# Patient Record
Sex: Female | Born: 2000
Health system: Southern US, Community
[De-identification: ages and names within clinical notes are randomized; demographics above are authoritative.]

## PROBLEM LIST (undated history)

## (undated) DIAGNOSIS — Z87820 Personal history of traumatic brain injury: Secondary | ICD-10-CM

## (undated) DIAGNOSIS — F32A Depression, unspecified: Secondary | ICD-10-CM

## (undated) DIAGNOSIS — F509 Eating disorder, unspecified: Secondary | ICD-10-CM

## (undated) DIAGNOSIS — N390 Urinary tract infection, site not specified: Secondary | ICD-10-CM

## (undated) DIAGNOSIS — S36113A Laceration of liver, unspecified degree, initial encounter: Secondary | ICD-10-CM

## (undated) DIAGNOSIS — Z789 Other specified health status: Secondary | ICD-10-CM

## (undated) HISTORY — DX: Eating disorder, unspecified: F50.9

## (undated) HISTORY — DX: Laceration of liver, unspecified degree, initial encounter: S36.113A

## (undated) HISTORY — DX: Urinary tract infection, site not specified: N39.0

## (undated) HISTORY — DX: Personal history of traumatic brain injury: Z87.820

## (undated) HISTORY — DX: Depression, unspecified: F32.A

---
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
## 2018-01-16 ENCOUNTER — Other Ambulatory Visit: Payer: Self-pay

## 2018-01-16 ENCOUNTER — Ambulatory Visit: Payer: BC Managed Care – PPO | Admitting: Family Medicine

## 2018-01-16 ENCOUNTER — Encounter: Payer: Self-pay | Admitting: Family Medicine

## 2018-01-16 VITALS — BP 116/76 | HR 88 | Temp 98.3°F | Ht 71.0 in | Wt 212.4 lb

## 2018-01-16 DIAGNOSIS — J029 Acute pharyngitis, unspecified: Secondary | ICD-10-CM | POA: Diagnosis not present

## 2018-01-16 DIAGNOSIS — J020 Streptococcal pharyngitis: Secondary | ICD-10-CM

## 2018-01-16 LAB — POCT RAPID STREP A (OFFICE): Rapid Strep A Screen: NEGATIVE

## 2018-01-16 MED ORDER — AZITHROMYCIN 250 MG PO TABS: ORAL_TABLET | ORAL | 0 refills | Status: DC

## 2018-01-16 NOTE — Progress Notes (Signed)
   Subjective  CC:  Chief Complaint  Patient presents with  . Sore Throat    x 10 days, nasal congestion and cough    HPI: Ashley Hodges is a 17 y.o. female who presents to the office today to address the problems listed above in the chief complaint.  C/o sore throat, with URI sxs without fever. No SOB or GI sxs. Thick productive cough. Has had strep in past. No known exposure ot strep or mono. OTC analgesics have been used with minimal or mild relief. Eating and drinking OK.  Some sweats. No sinus pain. I reviewed the patients updated PMH, FH, and SocHx.    Patient Active Problem List   Diagnosis Date Noted  . Stress fracture of lumbar vertebra 03/09/2017  . Adrenal hemorrhage (HCC) 09/23/2016  . Acne vulgaris 08/19/2016  . Family history of pulmonary embolism 08/19/2016   Current Meds  Medication Sig  . LESSINA-28 0.1-20 MG-MCG tablet Take 1 tablet by mouth daily.    Allergies: Patient has No Known Allergies.  Review of Systems: Constitutional: Negative for fever malaise or anorexia Cardiovascular: negative for chest pain Respiratory: negative for SOB or persistent cough Gastrointestinal: negative for abdominal pain  Objective  Vitals: BP 116/76   Pulse 88   Temp 98.3 F (36.8 C)   Ht 5\' 11"  (1.803 m)   Wt 212 lb 6.4 oz (96.3 kg)   SpO2 98%   BMI 29.62 kg/m  General: no acute distress , A&Ox3 HEENT: PEERL, conjunctiva normal, bilateral EAC and TMs are normal. Nares normal. Oropharynx moist with erythematous posterior pharynx with exudate, + cervical LAD, 2+ tonsils, midline uvula, neck is supple Cardiovascular:  RRR without murmur or gallop.  Respiratory:  Good breath sounds bilaterally, CTAB with normal respiratory effort Skin:  Warm, no rashes  Office Visit on 01/16/2018  Component Date Value Ref Range Status  . Rapid Strep A Screen 01/16/2018 Negative  Negative Final    Assessment  1. Sore throat   2. Strep throat      Plan   Treat for strep +/-  bronchitis. Zpak, and Supportive care with advil, tylenol, gargles etc discussed. Out of work today. RTO if sxs persist or worsen.   Declines flu vaccination  Follow up: due for wellness visit.     Commons side effects, risks, benefits, and alternatives for medications and treatment plan prescribed today were discussed, and the patient expressed understanding of the given instructions. Patient is instructed to call or message via MyChart if he/she has any questions or concerns regarding our treatment plan. No barriers to understanding were identified. We discussed Red Flag symptoms and signs in detail. Patient expressed understanding regarding what to do in case of urgent or emergency type symptoms.   Medication list was reconciled, printed and provided to the patient in AVS. Patient instructions and summary information was reviewed with the patient as documented in the AVS. This note was prepared with assistance of Dragon voice recognition software. Occasional wrong-word or sound-a-like substitutions may have occurred due to the inherent limitations of voice recognition software  Orders Placed This Encounter  Procedures  . POCT rapid strep A   Meds ordered this encounter  Medications  . azithromycin (ZITHROMAX) 250 MG tablet    Sig: Take 2 tabs today, then 1 tab daily for 4 days    Dispense:  1 each    Refill:  0

## 2018-01-16 NOTE — Patient Instructions (Signed)
Please return for your annual check up when you can.   You may use Delsym cough syrup or Mucinex DM to help with congestion and coughing. Take Advil or alleve for your sore throat.  Take all of your antibiotics.   If you have any questions or concerns, please don't hesitate to send me a message via MyChart or call the office at [phone]. Thank you for visiting with us today! It's our pleasure caring for you.

## 2018-03-26 ENCOUNTER — Ambulatory Visit: Payer: BC Managed Care – PPO | Admitting: Family Medicine

## 2018-03-26 ENCOUNTER — Ambulatory Visit (INDEPENDENT_AMBULATORY_CARE_PROVIDER_SITE_OTHER): Payer: BC Managed Care – PPO

## 2018-03-26 ENCOUNTER — Other Ambulatory Visit: Payer: Self-pay

## 2018-03-26 ENCOUNTER — Encounter: Payer: Self-pay | Admitting: Family Medicine

## 2018-03-26 VITALS — BP 118/64 | HR 67 | Temp 98.6°F | Resp 16 | Ht 71.0 in | Wt 206.5 lb

## 2018-03-26 DIAGNOSIS — M940 Chondrocostal junction syndrome [Tietze]: Secondary | ICD-10-CM | POA: Diagnosis not present

## 2018-03-26 DIAGNOSIS — R0781 Pleurodynia: Secondary | ICD-10-CM

## 2018-03-26 MED ORDER — DICLOFENAC SODIUM 75 MG PO TBEC: 75.0000 mg | DELAYED_RELEASE_TABLET | Freq: Two times a day (BID) | ORAL | 0 refills | Status: DC

## 2018-03-26 NOTE — Progress Notes (Signed)
Subjective  CC:  Chief Complaint  Patient presents with  . Chest discomfort    Started 1 - 2 weeks ago, episodes are sporadic. Pain are sharp at times.. Denies SOB, HA, and dizziness    HPI: Ashley Hodges is a 18 y.o. female who presents to the office today to address the problems listed above in the chief complaint.  Healthy 18 yo with acute intermittent left sided chest pain; at times at rest, sharp and associated with deep breaths. No cough, wheeze, uri sxs,f/c; no recent injuries. Also reports 2-3 months of similar sxs on the left side, although that is more dull and has been more constant. No neck pain. She is worried about heart disease or lung problems.  Denies GERD sxs. Appetite is good. Denies stressors.    Assessment  1. Costochondritis   2. Pleuritic chest pain      Plan   Most c/w coshtochondritis:  Education given. Start nsaids. Check cxr to reassure. Nl cardiac exam today.   Follow up: prn , due for CPE.  Visit date not found  Orders Placed This Encounter  Procedures  . DG Chest 2 View   Meds ordered this encounter  Medications  . diclofenac (VOLTAREN) 75 MG EC tablet    Sig: Take 1 tablet (75 mg total) by mouth 2 (two) times daily.    Dispense:  30 tablet    Refill:  0      I reviewed the patients updated PMH, FH, and SocHx.    Patient Active Problem List   Diagnosis Date Noted  . Stress fracture of lumbar vertebra 03/09/2017  . Adrenal hemorrhage (HCC) 09/23/2016  . Acne vulgaris 08/19/2016  . Family history of pulmonary embolism 08/19/2016   No outpatient medications have been marked as taking for the 03/26/18 encounter (Office Visit) with Willow Ora, MD.    Allergies: Patient has No Known Allergies. Family History: Patient family history includes Cancer in her paternal grandmother. Social History:  Patient  reports that she has never smoked. She has never used smokeless tobacco. She reports that she does not drink alcohol or use  drugs.  Review of Systems: Constitutional: Negative for fever malaise or anorexia Cardiovascular: negative for chest pain Respiratory: negative for SOB or persistent cough Gastrointestinal: negative for abdominal pain  Objective  Vitals: BP 118/64   Pulse 67   Temp 98.6 F (37 C) (Oral)   Resp 16   Ht 5\' 11"  (1.803 m)   Wt 206 lb 8 oz (93.7 kg)   LMP 03/02/2018   SpO2 99%   BMI 28.80 kg/m  General: no acute distress , A&Ox3 HEENT: PEERL, conjunctiva normal, Oropharynx moist,neck is supple Cardiovascular:  RRR without murmur or gallop.  Respiratory:  Good breath sounds bilaterally, CTAB with normal respiratory effort, + bilateral costochondral ttp that reproduces pain Gastrointestinal: soft, flat abdomen, normal active bowel sounds, no palpable masses, no hepatosplenomegaly, no appreciated hernias Skin:  Warm, no rashes     Commons side effects, risks, benefits, and alternatives for medications and treatment plan prescribed today were discussed, and the patient expressed understanding of the given instructions. Patient is instructed to call or message via MyChart if he/she has any questions or concerns regarding our treatment plan. No barriers to understanding were identified. We discussed Red Flag symptoms and signs in detail. Patient expressed understanding regarding what to do in case of urgent or emergency type symptoms.   Medication list was reconciled, printed and provided to the patient  in AVS. Patient instructions and summary information was reviewed with the patient as documented in the AVS. This note was prepared with assistance of Dragon voice recognition software. Occasional wrong-word or sound-a-like substitutions may have occurred due to the inherent limitations of voice recognition software

## 2018-03-26 NOTE — Patient Instructions (Signed)
Please return for your annual complete physical in the next 1-6 months.   Take the antiinflammatory medication twice a day with food to help with your pain for the next 10-14 days.   Please go to our [facility] office to get your xrays done. You can walk in M-F between 8am and 5pm. Tell them you are there for xrays ordered by me. They will send me the results, then I will let you know the results with instructions.   Address: [address] [place], [place], [place] [phone]  (office sits at [address] at [address] intersection; from here, turn left onto [place] [address]), take to [address], turn right and go for a mile or so, office will be on left across form [address] )  If you have any questions or concerns, please don't hesitate to send me a message via MyChart or call the office at [phone]. Thank you for visiting with [place] today! It's our pleasure caring for you.   Costochondritis  Costochondritis is swelling and irritation (inflammation) of the tissue (cartilage) that connects your ribs to your breastbone (sternum). This causes pain in the front of your chest. The pain usually starts gradually and involves more than one rib. What are the causes? The exact cause of this condition is not always known. It results from stress on the cartilage where your ribs attach to your sternum. The cause of this stress could be:  Chest injury (trauma).  Exercise or activity, such as lifting.  Severe coughing. What increases the risk? You may be at higher risk for this condition if you:  Are female.  Are 20 years old.  Recently started a new exercise or work activity.  Have low levels of vitamin D.  Have a condition that makes you cough frequently. What are the signs or symptoms? The main symptom of this condition is chest pain. The pain:  Usually starts gradually and can be sharp or dull.  Gets worse with deep breathing,  coughing, or exercise.  Gets better with rest.  May be worse when you press on the sternum-rib connection (tenderness). How is this diagnosed? This condition is diagnosed based on your symptoms, medical history, and a physical exam. Your health care provider will check for tenderness when pressing on your sternum. This is the most important finding. You may also have tests to rule out other causes of chest pain. These may include:  A chest X-ray to check for lung problems.  An electrocardiogram (ECG) to see if you have a heart problem that could be causing the pain.  An imaging scan to rule out a chest or rib fracture. How is this treated? This condition usually goes away on its own over time. Your health care provider may prescribe an NSAID to reduce pain and inflammation. Your health care provider may also suggest that you:  Rest and avoid activities that make pain worse.  Apply heat or cold to the area to reduce pain and inflammation.  Do exercises to stretch your chest muscles. If these treatments do not help, your health care provider may inject a numbing medicine at the sternum-rib connection to help relieve the pain. Follow these instructions at home:  Avoid activities that make pain worse. This includes any activities that use chest, abdominal, and side muscles.  If directed, put ice on the painful area: ? Put ice in a plastic bag. ? Place a towel between your skin and the bag. ?  Leave the ice on for 20 minutes, 2-3 times a day.  If directed, apply heat to the affected area as often as told by your health care provider. Use the heat source that your health care provider recommends, such as a moist heat pack or a heating pad. ? Place a towel between your skin and the heat source. ? Leave the heat on for 20-30 minutes. ? Remove the heat if your skin turns bright red. This is especially important if you are unable to feel pain, heat, or cold. You may have a greater risk of  getting burned.  Take over-the-counter and prescription medicines only as told by your health care provider.  Return to your normal activities as told by your health care provider. Ask your health care provider what activities are safe for you.  Keep all follow-up visits as told by your health care provider. This is important. Contact a health care provider if:  You have chills or a fever.  Your pain does not go away or it gets worse.  You have a cough that does not go away (is persistent). Get help right away if:  You have shortness of breath. This information is not intended to replace advice given to you by your health care provider. Make sure you discuss any questions you have with your health care provider. Document Released: 11/23/2004 Document Revised: 11/15/2016 Document Reviewed: 06/09/2015 Elsevier Interactive Patient Education  [employer].

## 2018-03-27 MED ORDER — AZITHROMYCIN 250 MG PO TABS
ORAL_TABLET | ORAL | 0 refills | Status: DC
Start: 2018-03-27 — End: 2018-10-03

## 2018-03-27 NOTE — Addendum Note (Signed)
Addended by: Asencion PartridgeANDY, Yitzchok Carriger on: 03/27/2018 12:01 PM   Modules accepted: Orders

## 2018-04-26 ENCOUNTER — Other Ambulatory Visit: Payer: Self-pay | Admitting: Family Medicine

## 2018-05-30 ENCOUNTER — Other Ambulatory Visit: Payer: Self-pay | Admitting: Family Medicine

## 2018-07-01 ENCOUNTER — Other Ambulatory Visit: Payer: Self-pay | Admitting: Family Medicine

## 2018-07-01 NOTE — Telephone Encounter (Signed)
Patient is due a cpe. Please call to schedule in the next 2-6 months. Thanks. Refilled ocp x 6 months

## 2018-10-03 ENCOUNTER — Encounter: Payer: Self-pay | Admitting: Family Medicine

## 2018-10-03 ENCOUNTER — Other Ambulatory Visit: Payer: Self-pay

## 2018-10-03 ENCOUNTER — Ambulatory Visit (INDEPENDENT_AMBULATORY_CARE_PROVIDER_SITE_OTHER): Payer: BC Managed Care – PPO | Admitting: Family Medicine

## 2018-10-03 VITALS — BP 166/70 | HR 68 | Temp 98.8°F | Resp 14 | Ht 71.0 in | Wt 169.8 lb

## 2018-10-03 DIAGNOSIS — Z23 Encounter for immunization: Secondary | ICD-10-CM

## 2018-10-03 DIAGNOSIS — N946 Dysmenorrhea, unspecified: Secondary | ICD-10-CM | POA: Diagnosis not present

## 2018-10-03 DIAGNOSIS — Z0000 Encounter for general adult medical examination without abnormal findings: Secondary | ICD-10-CM | POA: Diagnosis not present
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
## 2018-10-03 NOTE — Patient Instructions (Signed)
Please return in 12 months for your annual complete physical; please come fasting. Also please schedule a nurse visit in 6 months to get your 2nd Bexsero vaccination.  Congratulations on [employer]; it will be fantastic.  Keep up the healthy lifestyle.   Today you were given your 1st of 2 Bexsero vaccination. This helps protect you from meningitis B. The second is due in 6 months.   If you have any questions or concerns, please don't hesitate to send me a message via MyChart or call the office at [phone]. Thank you for visiting with Ashley Hodges today! It's our pleasure caring for you.   Preventive Care 57-68 Years Old, Female Preventive care refers to visits with your health care provider and lifestyle choices that can promote health and wellness. This includes:  A yearly physical exam. This may also be called an annual well check.  Regular dental visits and eye exams.  Immunizations.  Screening for certain conditions.  Healthy lifestyle choices, such as eating a healthy diet, getting regular exercise, not using drugs or products that contain nicotine and tobacco, and limiting alcohol use. What can I expect for my preventive care visit? Physical exam Your health care provider will check your:  Height and weight. This may be used to calculate body mass index (BMI), which tells if you are at a healthy weight.  Heart rate and blood pressure.  Skin for abnormal spots. Counseling Your health care provider may ask you questions about your:  Alcohol, tobacco, and drug use.  Emotional well-being.  Home and relationship well-being.  Sexual activity.  Eating habits.  Work and work Statistician.  Method of birth control.  Menstrual cycle.  Pregnancy history. What immunizations do I need?  Influenza (flu) vaccine  This is recommended every year. Tetanus, diphtheria, and pertussis (Tdap) vaccine  You may need a Td booster every 10 years. Varicella (chickenpox) vaccine  You may  need this if you have not been vaccinated. Human papillomavirus (HPV) vaccine  If recommended by your health care provider, you may need three doses over 6 months. Measles, mumps, and rubella (MMR) vaccine  You may need at least one dose of MMR. You may also need a second dose. Meningococcal conjugate (MenACWY) vaccine  One dose is recommended if you are age 44-21 years and a first-year college student living in a residence hall, or if you have one of several medical conditions. You may also need additional booster doses. Pneumococcal conjugate (PCV13) vaccine  You may need this if you have certain conditions and were not previously vaccinated. Pneumococcal polysaccharide (PPSV23) vaccine  You may need one or two doses if you smoke cigarettes or if you have certain conditions. Hepatitis A vaccine  You may need this if you have certain conditions or if you travel or work in places where you may be exposed to hepatitis A. Hepatitis B vaccine  You may need this if you have certain conditions or if you travel or work in places where you may be exposed to hepatitis B. Haemophilus influenzae type b (Hib) vaccine  You may need this if you have certain conditions. You may receive vaccines as individual doses or as more than one vaccine together in one shot (combination vaccines). Talk with your health care provider about the risks and benefits of combination vaccines. What tests do I need?  Blood tests  Lipid and cholesterol levels. These may be checked every 5 years starting at age 4.  Hepatitis C test.  Hepatitis B test. Screening  Diabetes screening. This is done by checking your blood sugar (glucose) after you have not eaten for a while (fasting).  Sexually transmitted disease (STD) testing.  BRCA-related cancer screening. This may be done if you have a family history of breast, ovarian, tubal, or peritoneal cancers.  Pelvic exam and Pap test. This may be done every 3 years  starting at age 42. Starting at age 12, this may be done every 5 years if you have a Pap test in combination with an HPV test. Talk with your health care provider about your test results, treatment options, and if necessary, the need for more tests. Follow these instructions at home: Eating and drinking   Eat a diet that includes fresh fruits and vegetables, whole grains, lean protein, and low-fat dairy.  Take vitamin and mineral supplements as recommended by your health care provider.  Do not drink alcohol if: ? Your health care provider tells you not to drink. ? You are pregnant, may be pregnant, or are planning to become pregnant.  If you drink alcohol: ? Limit how much you have to 0-1 drink a day. ? Be aware of how much alcohol is in your drink. In the [place] one drink equals one 12 oz bottle of beer (355 mL), one 5 oz glass of wine (148 mL), or one 1 oz glass of hard liquor (44 mL). Lifestyle  Take daily care of your teeth and gums.  Stay active. Exercise for at least 30 minutes on 5 or more days each week.  Do not use any products that contain nicotine or tobacco, such as cigarettes, e-cigarettes, and chewing tobacco. If you need help quitting, ask your health care provider.  If you are sexually active, practice safe sex. Use a condom or other form of birth control (contraception) in order to prevent pregnancy and STIs (sexually transmitted infections). If you plan to become pregnant, see your health care provider for a preconception visit. What's next?  Visit your health care provider once a year for a well check visit.  Ask your health care provider how often you should have your eyes and teeth checked.  Stay up to date on all vaccines. This information is not intended to replace advice given to you by your health care provider. Make sure you discuss any questions you have with your health care provider. Document Released: 04/11/2001 Document Revised: 10/25/2017 Document  Reviewed: 10/25/2017 Elsevier Patient Education  2020 [employer].

## 2018-12-15 ENCOUNTER — Other Ambulatory Visit: Payer: Self-pay | Admitting: Family Medicine

## 2018-12-16 ENCOUNTER — Other Ambulatory Visit: Payer: Self-pay | Admitting: *Deleted

## 2019-02-10 ENCOUNTER — Other Ambulatory Visit: Payer: Self-pay

## 2019-02-10 ENCOUNTER — Ambulatory Visit (INDEPENDENT_AMBULATORY_CARE_PROVIDER_SITE_OTHER): Payer: BC Managed Care – PPO | Admitting: Family Medicine

## 2019-02-10 DIAGNOSIS — J029 Acute pharyngitis, unspecified: Secondary | ICD-10-CM | POA: Diagnosis not present

## 2019-02-10 DIAGNOSIS — Z20822 Contact with and (suspected) exposure to covid-19: Secondary | ICD-10-CM

## 2019-02-10 MED ORDER — AZELASTINE HCL 0.1 % NA SOLN: 2.0000 | Freq: Two times a day (BID) | NASAL | 12 refills | Status: DC

## 2019-02-10 NOTE — Progress Notes (Signed)
    Chief Complaint:  Ashley Hodges is a 18 y.o. female who presents today for a virtual office visit with a chief complaint of sore throat.   Assessment/Plan:  Sore Throat Likely viral URI.  Will check for Covid.  No red flags.  Will start Astelin nasal spray.  Recommended good oral hydration.  Discussed reasons to return to care.  Can use over-the-counter medications as needed.  Follow-up as needed.  Discussed CDC recommendations for self-isolation.    Subjective:  HPI:  Sore Throat Started yesterday. Had a hard time sleeping last night. Some fatigue as well. Some headache this morning. Some subjective fevers and chills as well. Tried taking aleve which did not help. No sick contacts. No cough. Some nasal drainage and congestion. No chest pain or shortness of breath.  No other obvious aggravating or alleviating factors.  ROS: Per HPI  PMH: She reports that she has never smoked. She has never used smokeless tobacco. She reports that she does not drink alcohol or use drugs.      Objective/Observations  Physical Exam: Gen: NAD, resting comfortably Pulm: Normal work of breathing Neuro: Grossly normal, moves all extremities Psych: Normal affect and thought content  No results found for this or any previous visit (from the past 24 hour(s)).   Virtual Visit via Video   I connected with Ashley Hodges on 02/10/19 at 11:20 AM EST by a video enabled telemedicine application and verified that I am speaking with the correct person using two identifiers. The limitations of evaluation and management by telemedicine and the availability of in person appointments were discussed. The patient expressed understanding and agreed to proceed.   Patient location: Home Provider location: [place] participating in the virtual visit: Myself and Patient     Algis Greenhouse. Jerline Pain, MD 02/10/2019 10:11 AM

## 2019-02-11 LAB — NOVEL CORONAVIRUS, NAA: SARS-CoV-2, NAA: NOT DETECTED

## 2019-02-12 NOTE — Progress Notes (Signed)
Dr Marigene Ehlers interpretation of your lab work:  Good news! Your covid test is negative. Please let us know if your symptoms are not improving.    If you have any additional questions, please give [place] a call or send [place] a message through [place].  Take care, Dr Jerline Pain

## 2019-10-24 LAB — HM HEPATITIS C SCREENING LAB: HM Hepatitis Screen: NEGATIVE

## 2020-08-17 ENCOUNTER — Other Ambulatory Visit (INDEPENDENT_AMBULATORY_CARE_PROVIDER_SITE_OTHER): Payer: Self-pay | Admitting: Family Medicine

## 2020-08-17 ENCOUNTER — Other Ambulatory Visit (HOSPITAL_COMMUNITY): Payer: Self-pay

## 2020-08-17 DIAGNOSIS — U071 COVID-19: Secondary | ICD-10-CM

## 2020-08-17 MED ORDER — NIRMATRELVIR/RITONAVIR (PAXLOVID)TABLET
3.0000 | ORAL_TABLET | Freq: Two times a day (BID) | ORAL | 0 refills | Status: AC
  Filled 2020-08-17: qty 30, 5d supply, fill #0

## 2020-08-17 NOTE — Progress Notes (Signed)
+   COVID 19 Virus Infection. Normal GFR. Paxlovid sent to [facility]. Risk versus benefits of medication reviewed. The patient understands monitoring parameters and red flags. Helane Rima, DO

## 2021-01-12 ENCOUNTER — Telehealth: Payer: Self-pay

## 2021-01-12 NOTE — Telephone Encounter (Signed)
Pt is requesting TOC to Dr. Shanda Bumps Copland at [facility].  Please advise if this is okay with you.

## 2021-01-12 NOTE — Telephone Encounter (Signed)
Pt is a current pt of Dr. Asencion Partridge at [facility].  I will send a TOC request to her and then we can set pt up for an appt with Dr. Patsy Lager.

## 2021-01-12 NOTE — Telephone Encounter (Signed)
Dr Copland has agreed to see this pt. They are okay with whatever NP appointments are available. 

## 2021-02-04 NOTE — Patient Instructions (Addendum)
It was very nice to meet you today!  I will be in touch with your labs and x-rays asap If you feel like you have a UTI coming up please let me know and we can get a urine culture Test for covid at home to make sure you are negative- if positive let me know   Tetanus booster today
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
## 2021-02-09 ENCOUNTER — Encounter: Payer: Self-pay | Admitting: Family Medicine

## 2021-02-09 ENCOUNTER — Ambulatory Visit: Payer: Managed Care, Other (non HMO) | Admitting: Family Medicine

## 2021-02-09 ENCOUNTER — Other Ambulatory Visit: Payer: Self-pay

## 2021-02-09 ENCOUNTER — Ambulatory Visit (HOSPITAL_BASED_OUTPATIENT_CLINIC_OR_DEPARTMENT_OTHER)
Admission: RE | Admit: 2021-02-09 | Discharge: 2021-02-09 | Disposition: A | Discharge status: Home / Self Care | Payer: Managed Care, Other (non HMO) | Source: Ambulatory Visit | Attending: Family Medicine | Admitting: Family Medicine

## 2021-02-09 VITALS — BP 122/80 | HR 72 | Temp 98.1°F | Resp 18 | Ht 71.0 in | Wt 197.6 lb

## 2021-02-09 DIAGNOSIS — Z0000 Encounter for general adult medical examination without abnormal findings: Secondary | ICD-10-CM

## 2021-02-09 DIAGNOSIS — N390 Urinary tract infection, site not specified: Secondary | ICD-10-CM

## 2021-02-09 DIAGNOSIS — M4300 Spondylolysis, site unspecified: Secondary | ICD-10-CM

## 2021-02-09 DIAGNOSIS — R5383 Other fatigue: Secondary | ICD-10-CM

## 2021-02-09 DIAGNOSIS — G8929 Other chronic pain: Secondary | ICD-10-CM

## 2021-02-09 DIAGNOSIS — Z131 Encounter for screening for diabetes mellitus: Secondary | ICD-10-CM

## 2021-02-09 DIAGNOSIS — M545 Low back pain, unspecified: Secondary | ICD-10-CM

## 2021-02-09 DIAGNOSIS — Z13220 Encounter for screening for lipoid disorders: Secondary | ICD-10-CM | POA: Diagnosis not present

## 2021-02-09 DIAGNOSIS — Z1329 Encounter for screening for other suspected endocrine disorder: Secondary | ICD-10-CM

## 2021-02-09 DIAGNOSIS — Z130 Encounter for screening for diseases of the blood and blood-forming organs and certain disorders involving the immune mechanism: Secondary | ICD-10-CM | POA: Diagnosis not present

## 2021-02-09 DIAGNOSIS — Z23 Encounter for immunization: Secondary | ICD-10-CM | POA: Diagnosis not present

## 2021-02-09 LAB — COMPREHENSIVE METABOLIC PANEL
ALT: 9 U/L (ref 0–35)
AST: 16 U/L (ref 0–37)
Albumin: 4.1 g/dL (ref 3.5–5.2)
Alkaline Phosphatase: 57 U/L (ref 39–117)
BUN: 12 mg/dL (ref 6–23)
CO2: 28 mEq/L (ref 19–32)
Calcium: 9.3 mg/dL (ref 8.4–10.5)
Chloride: 103 mEq/L (ref 96–112)
Creatinine, Ser: 0.74 mg/dL (ref 0.40–1.20)
GFR: 114.92 mL/min (ref >60.00)
Glucose, Bld: 87 mg/dL (ref 70–99)
Potassium: 4.1 mEq/L (ref 3.5–5.1)
Sodium: 136 mEq/L (ref 135–145)
Total Bilirubin: 0.6 mg/dL (ref 0.2–1.2)
Total Protein: 7.2 g/dL (ref 6.0–8.3)

## 2021-02-09 LAB — CBC
HCT: 38.3 % (ref 36.0–46.0)
Hemoglobin: 12.7 g/dL (ref 12.0–15.0)
MCHC: 33.1 g/dL (ref 30.0–36.0)
MCV: 90 fl (ref 78.0–100.0)
Platelets: 271 10*3/uL (ref 150.0–400.0)
RBC: 4.25 Mil/uL (ref 3.87–5.11)
RDW: 13.1 % (ref 11.5–15.5)
WBC: 5.3 10*3/uL (ref 4.0–10.5)

## 2021-02-09 LAB — LIPID PANEL
Cholesterol: 179 mg/dL (ref 0–200)
HDL: 62 mg/dL (ref >39.00)
LDL Cholesterol: 95 mg/dL (ref 0–99)
NonHDL: 116.69
Total CHOL/HDL Ratio: 3
Triglycerides: 107 mg/dL (ref 0.0–149.0)
VLDL: 21.4 mg/dL (ref 0.0–40.0)

## 2021-02-09 LAB — VITAMIN D 25 HYDROXY (VIT D DEFICIENCY, FRACTURES): VITD: 24.07 ng/mL — ABNORMAL LOW (ref 30.00–100.00)

## 2021-02-09 LAB — TSH: TSH: 2.35 u[IU]/mL (ref 0.35–5.50)

## 2021-02-09 LAB — HEMOGLOBIN A1C: Hgb A1c MFr Bld: 5.4 % (ref 4.6–6.5)

## 2021-02-11 ENCOUNTER — Other Ambulatory Visit: Payer: Self-pay | Admitting: Family Medicine

## 2021-02-11 DIAGNOSIS — M4300 Spondylolysis, site unspecified: Secondary | ICD-10-CM

## 2021-02-11 NOTE — Addendum Note (Signed)
Addended by: Abbe Amsterdam C on: 02/11/2021 08:34 AM   Modules accepted: Orders

## 2021-10-05 ENCOUNTER — Encounter (INDEPENDENT_AMBULATORY_CARE_PROVIDER_SITE_OTHER): Payer: Self-pay

## 2022-04-26 IMAGING — DX DG LUMBAR SPINE COMPLETE 4+V
5 series · 5 of 5 positions shown · non-contrast
Comparison: None.

CLINICAL DATA: Pain lower back

EXAM:
LUMBAR SPINE - COMPLETE 4+ VIEW

[l-spine ap]
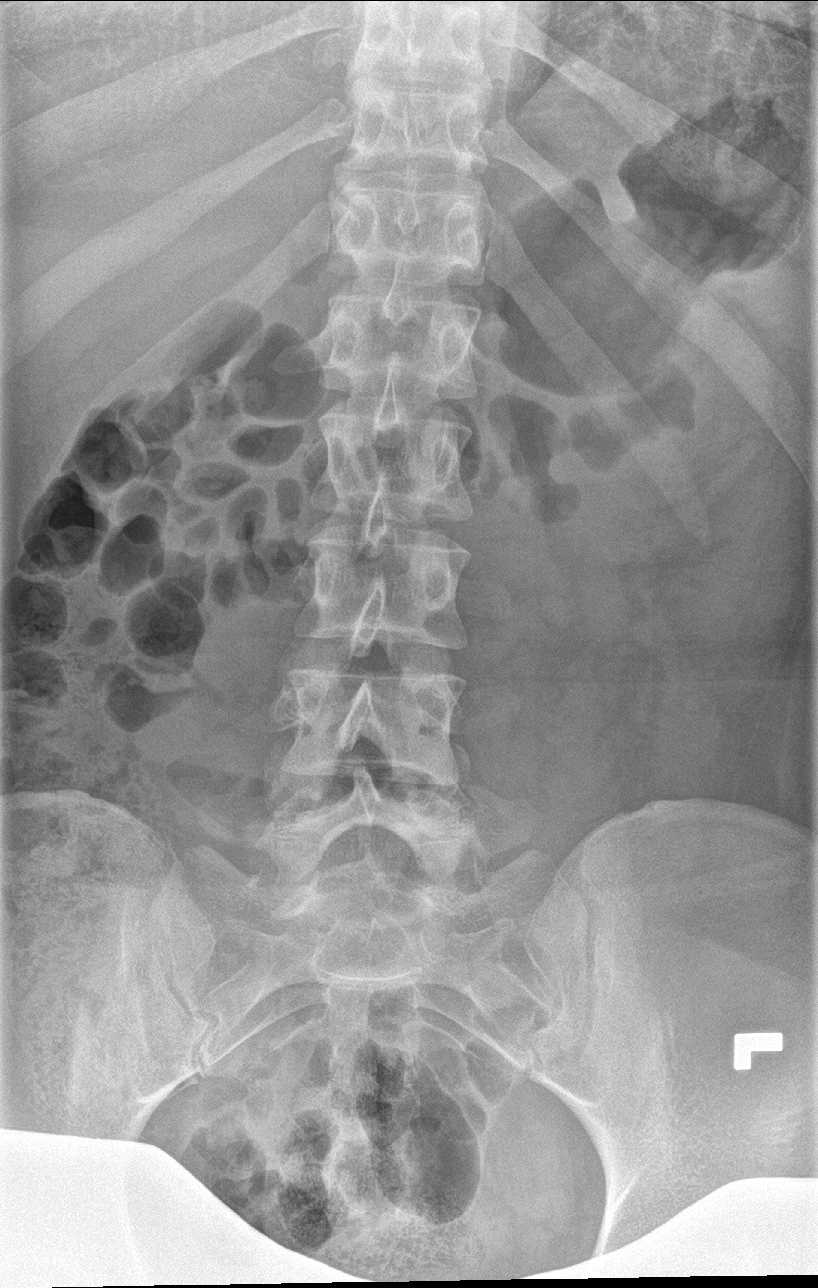

[l-spine obl (1 of 2)]
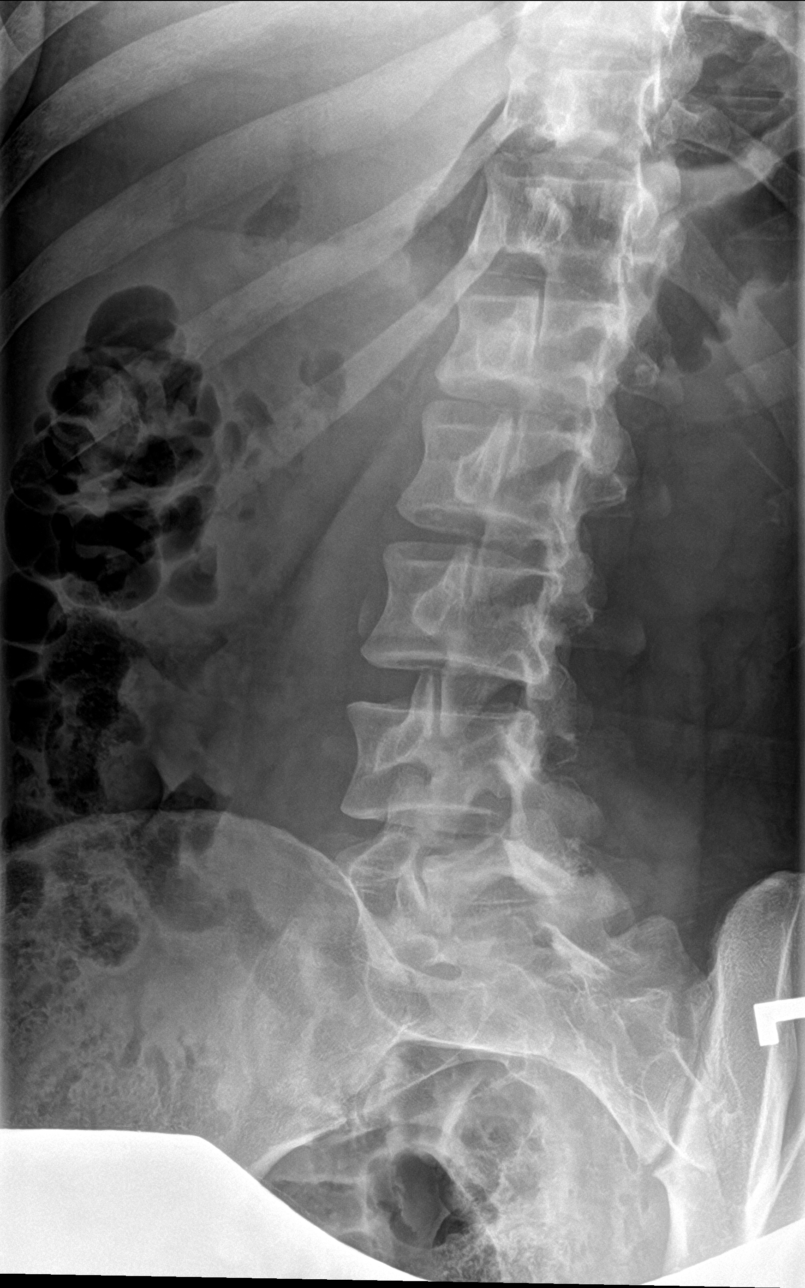

[l-spine obl (2 of 2)]
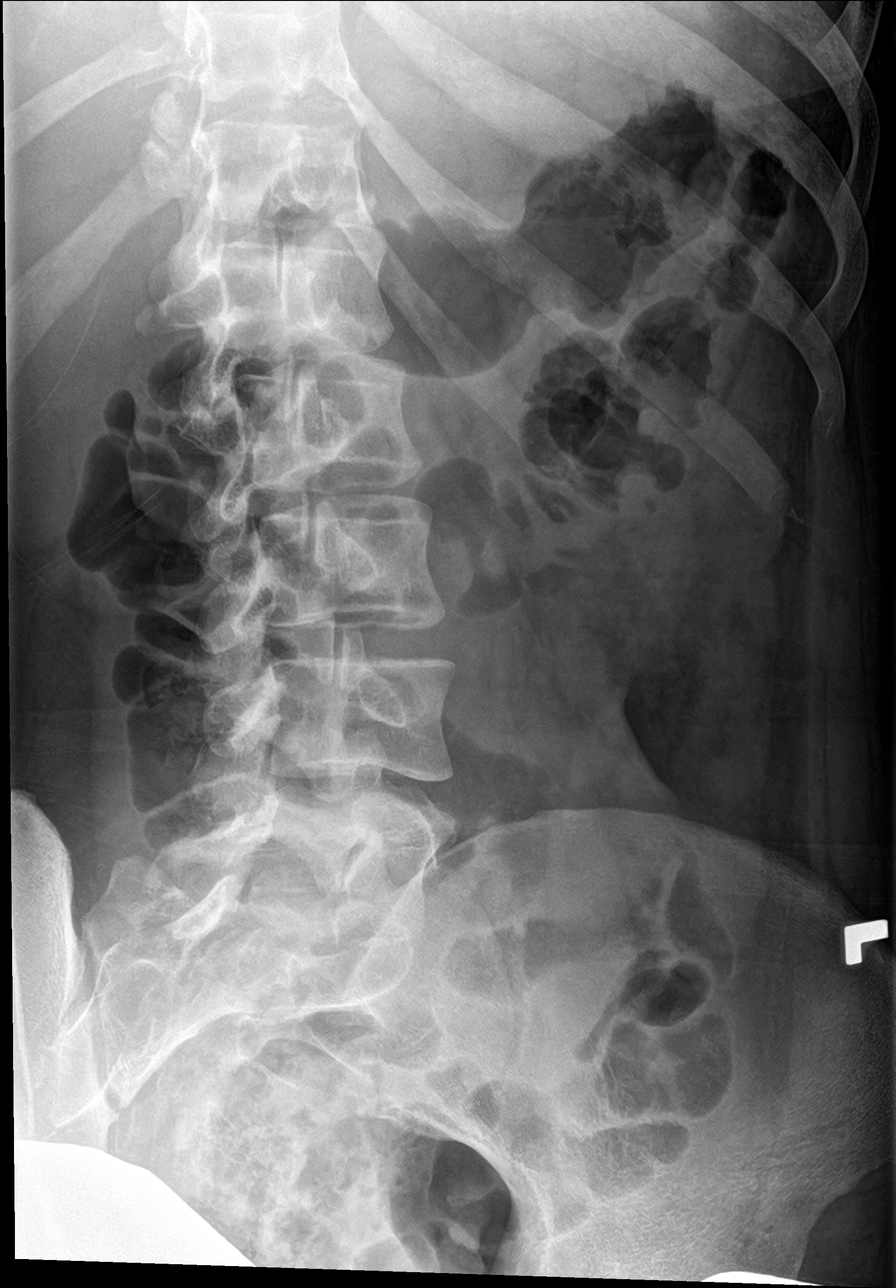

[l-spine lat]
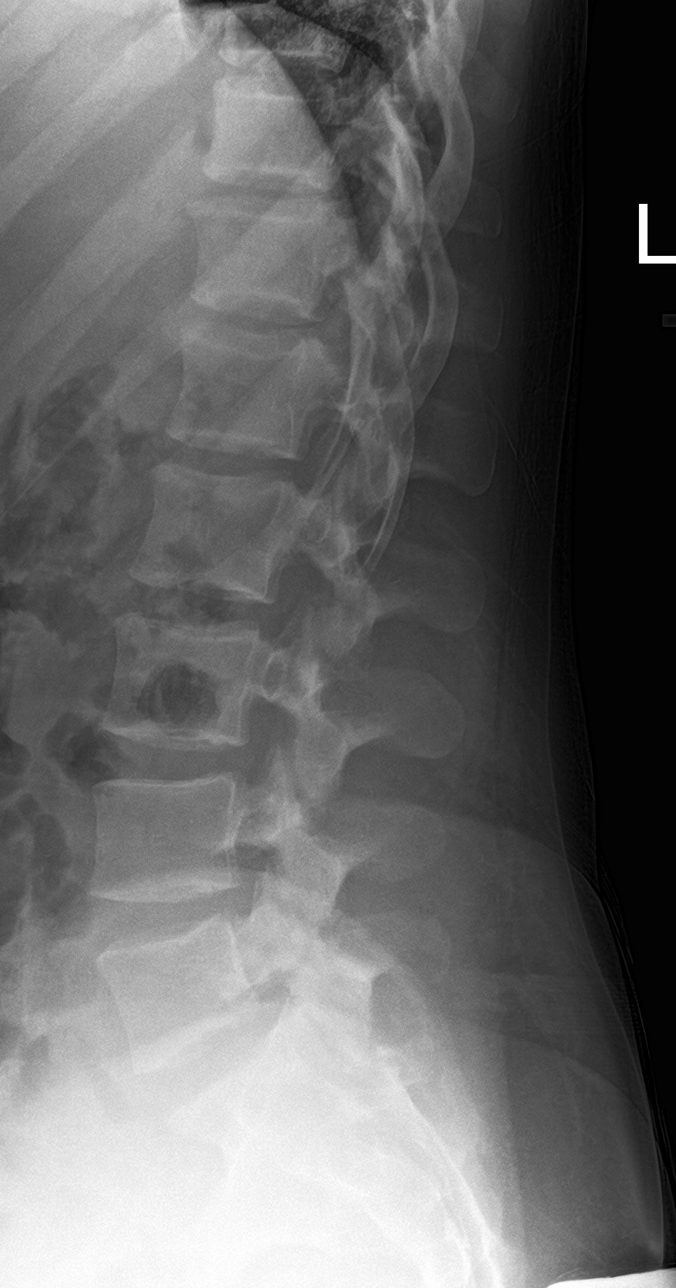

[l-spine spot]
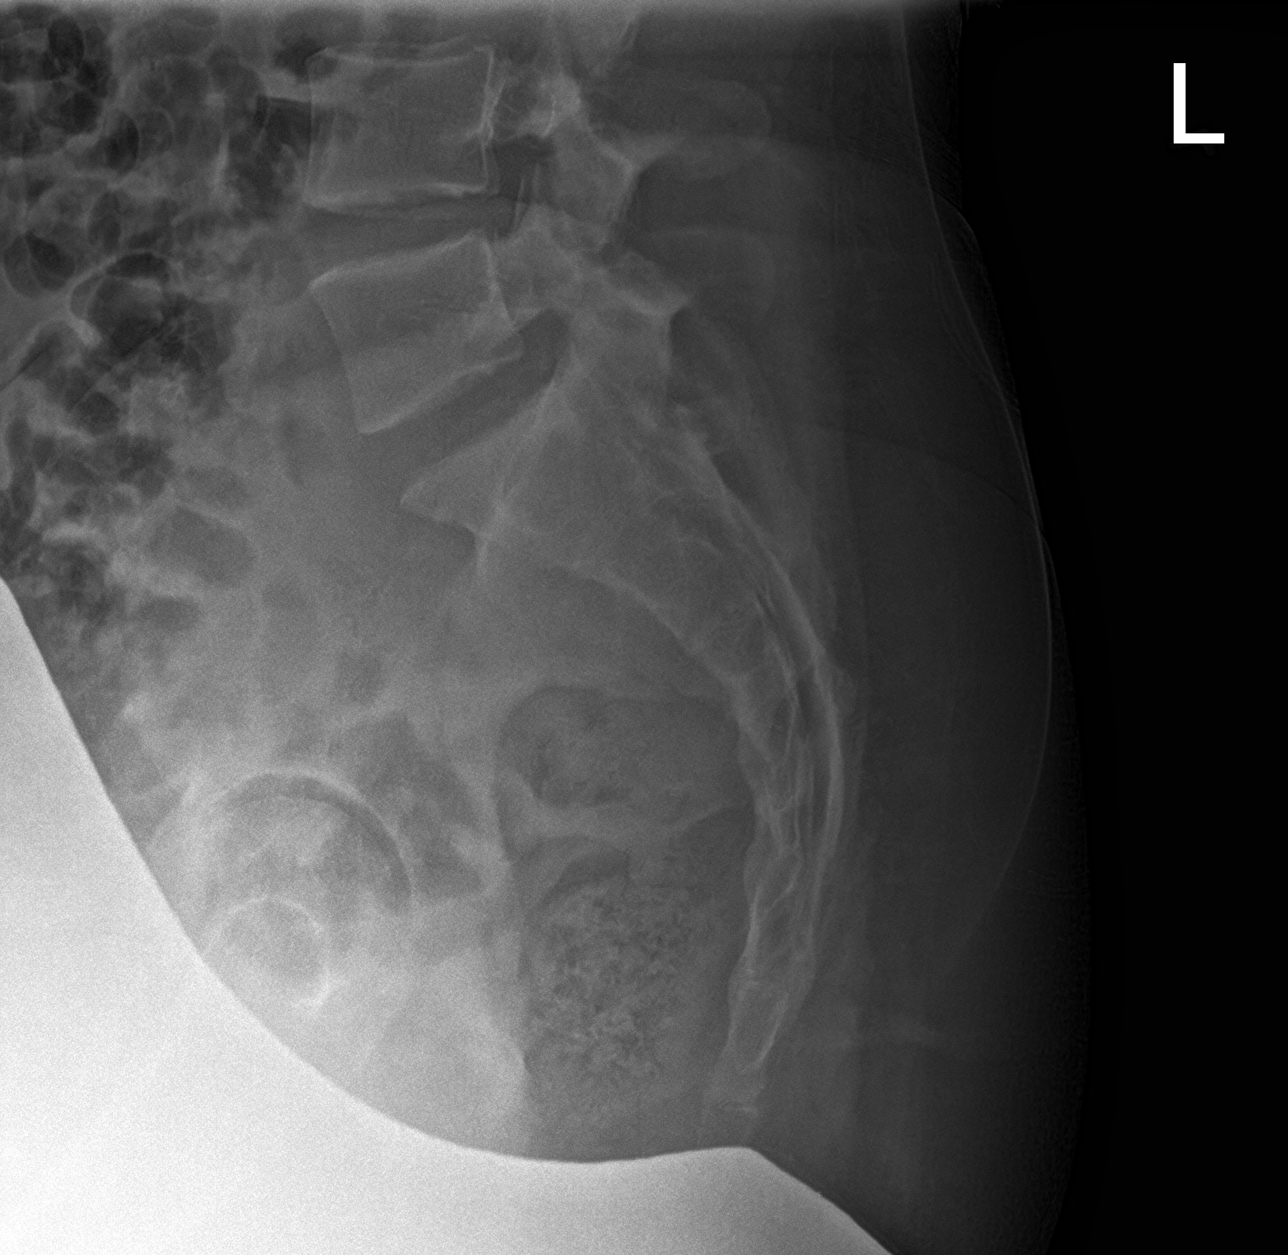

[5 of 5 positions shown; findings below may reference images not displayed]

FINDINGS: No recent fracture is seen. There is 5 mm retrolisthesis at L4-L5
level. Defect is seen in the left degenerative changes are noted in
facet joints in the lower lumbar spine. Paraspinal soft tissues are
unremarkable. Pars articularis in the L5 vertebra.
IMPRESSION: No recent fracture is seen. There is no significant disc space
narrowing.

Degenerative changes are noted in facet joints in the lower lumbar
spine. There is minimal 5 mm retrolisthesis at L4-L5 level. There is
defect in the left pars interarticularis of L5 vertebra.

## 2022-10-10 LAB — HM PAP SMEAR

## 2022-10-10 LAB — RESULTS CONSOLE HPV: CHL HPV: NEGATIVE

## 2022-10-23 ENCOUNTER — Emergency Department (HOSPITAL_BASED_OUTPATIENT_CLINIC_OR_DEPARTMENT_OTHER)
Admission: EM | Admit: 2022-10-23 | Discharge: 2022-10-23 | Disposition: A | Discharge status: Home / Self Care | Payer: Managed Care, Other (non HMO) | Attending: Emergency Medicine | Admitting: Emergency Medicine

## 2022-10-23 ENCOUNTER — Emergency Department (HOSPITAL_BASED_OUTPATIENT_CLINIC_OR_DEPARTMENT_OTHER): Payer: Managed Care, Other (non HMO) | Admitting: Radiology

## 2022-10-23 ENCOUNTER — Encounter (HOSPITAL_BASED_OUTPATIENT_CLINIC_OR_DEPARTMENT_OTHER): Payer: Self-pay | Admitting: Emergency Medicine

## 2022-10-23 ENCOUNTER — Other Ambulatory Visit: Payer: Self-pay

## 2022-10-23 DIAGNOSIS — R079 Chest pain, unspecified: Secondary | ICD-10-CM | POA: Diagnosis present

## 2022-10-23 LAB — BASIC METABOLIC PANEL
Anion gap: 8 (ref 5–15)
BUN: 12 mg/dL (ref 6–20)
CO2: 25 mmol/L (ref 22–32)
Calcium: 8.8 mg/dL — ABNORMAL LOW (ref 8.9–10.3)
Chloride: 103 mmol/L (ref 98–111)
Creatinine, Ser: 0.83 mg/dL (ref 0.44–1.00)
GFR, Estimated: >60 mL/min (ref >60)
Glucose, Bld: 99 mg/dL (ref 70–99)
Potassium: 3.9 mmol/L (ref 3.5–5.1)
Sodium: 136 mmol/L (ref 135–145)

## 2022-10-23 LAB — CBC
HCT: 37.3 % (ref 36.0–46.0)
Hemoglobin: 12.7 g/dL (ref 12.0–15.0)
MCH: 30.1 pg (ref 26.0–34.0)
MCHC: 34 g/dL (ref 30.0–36.0)
MCV: 88.4 fL (ref 80.0–100.0)
Platelets: 338 10*3/uL (ref 150–400)
RBC: 4.22 MIL/uL (ref 3.87–5.11)
RDW: 12.4 % (ref 11.5–15.5)
WBC: 5.1 10*3/uL (ref 4.0–10.5)
nRBC: 0 % (ref 0.0–0.2)

## 2022-10-23 LAB — TROPONIN I (HIGH SENSITIVITY)
Troponin I (High Sensitivity): 4 ng/L (ref <18)
Troponin I (High Sensitivity): 3 ng/L (ref <18)

## 2022-10-23 LAB — D-DIMER, QUANTITATIVE: D-Dimer, Quant: <0.27 ug{FEU}/mL (ref 0.00–0.50)

## 2022-10-23 LAB — PREGNANCY, URINE: Preg Test, Ur: NEGATIVE

## 2022-10-23 NOTE — ED Provider Notes (Signed)
[facility] AT [facility] Provider Note   CSN: [id number] Arrival date & time: 10/23/22  1638     History  Chief Complaint  Patient presents with   Chest Pain    Ashley Hodges is a 22 y.o. female.  Patient here for pain in the left chest extending to back and under left breast. Symptoms started 5 days ago and are intermittent. She notices it seems to be associated with rest rather than activity, and there is a mild SOB. No measured fever, but reports feeling "hot". No nausea, vomiting. She reports concern for PE because she has been on birth control for "a long time", and also reports a family history of PE in her mom secondary to birth control use.   The history is provided by the patient. No language interpreter was used.  Chest Pain Pain location:  L chest      Home Medications Prior to Admission medications   Medication Sig Start Date End Date Taking? Authorizing Provider  ZAFEMY 150-35 MCG/24HR transdermal patch Place 1 patch onto the skin once a week. 12/30/20   [provider]      Allergies    Patient has no known allergies.    Review of Systems   Review of Systems  Cardiovascular:  Positive for chest pain.    Physical Exam Updated Vital Signs BP 126/85 (BP Location: Right Arm)   Pulse 78   Temp 98.1 F (36.7 C) (Oral)   Resp 16   Ht 5' 10.75" (1.797 m)   Wt 88.5 kg   LMP 10/10/2022 (Exact Date)   SpO2 100%   BMI 27.39 kg/m  Physical Exam Vitals and nursing note reviewed.  Constitutional:      General: She is not in acute distress.    Appearance: She is well-developed.  Cardiovascular:     Rate and Rhythm: Normal rate and regular rhythm.     Heart sounds: No murmur heard. Pulmonary:     Effort: Pulmonary effort is normal.     Breath sounds: Normal breath sounds.  Abdominal:     General: There is no abdominal bruit.  Musculoskeletal:     Cervical back: Normal range of motion.     Right lower leg: No  tenderness. No edema.     Left lower leg: No tenderness. No edema.  Skin:    General: Skin is warm and dry.  Neurological:     Mental Status: She is alert and oriented to person, place, and time.     ED Results / Procedures / Treatments   Labs (all labs ordered are listed, but only abnormal results are displayed) Labs Reviewed  BASIC METABOLIC PANEL - Abnormal; Notable for the following components:      Result Value   Calcium 8.8 (*)    All other components within normal limits  CBC  PREGNANCY, URINE  D-DIMER, QUANTITATIVE  TROPONIN I (HIGH SENSITIVITY)  TROPONIN I (HIGH SENSITIVITY)    EKG EKG Interpretation Date/Time:  Monday October 23 2022 16:47:56 EDT Ventricular Rate:  69 PR Interval:  160 QRS Duration:  82 QT Interval:  410 QTC Calculation: 439 R Axis:   88  Text Interpretation: Normal sinus rhythm Cannot rule out Anterior infarct , age undetermined Abnormal ECG No previous ECGs available Confirmed by Fulton Reek [phone]) on 10/23/2022 4:57:33 PM  Radiology DG Chest 2 View  Result Date: 10/23/2022 CLINICAL DATA:  Chest pain. EXAM: CHEST - 2 VIEW COMPARISON:  March 26, 2018  FINDINGS: The heart size and mediastinal contours are within normal limits. There is no evidence of an acute infiltrate, pleural effusion or pneumothorax. The visualized skeletal structures are unremarkable. IMPRESSION: No active cardiopulmonary disease. Electronically Signed   By: Aram Candela M.D.   On: 10/23/2022 18:13    Procedures Procedures    Medications Ordered in ED Medications - No data to display  ED Course/ Medical Decision Making/ A&P Clinical Course as of 10/23/22 1931  Mon Oct 23, 2022  1843 22 yo female with left chest pain x 5 days, on long term birth control, family history of PE in her mom, concerned for clot. ECG without ischemic change, CXR interpreted as clear, c/w radiology read, labs normal. No tachycardia or hypoxia so low degree of suspicion for PE,  however, has risk factors. Will get d-dimer.  [SU]  1928 D-dimer negative. Wells criteria, low risk (0.0). Normal VS. CTA r/o PE not indicated. This was discussed with the patient and family. Ok to discharge. REturn precautions discussed.  [SU]    Clinical Course User Index [SU] Elpidio Anis, PA-C                                 Medical Decision Making Amount and/or Complexity of Data Reviewed Labs: ordered. Radiology: ordered.           Final Clinical Impression(s) / ED Diagnoses Final diagnoses:  Nonspecific chest pain    Rx / DC Orders ED Discharge Orders     None         Elpidio Anis, Cordelia Poche 10/23/22 1931    Laurence Spates, MD 10/24/22 [phone]

## 2022-10-23 NOTE — ED Triage Notes (Signed)
Pt via pov from home with chest pain since last Wednesday night. Pt reports that she recently started nuvaring for birth control. Her mother has hx of blood clot from OC use. Pt endorses sob, states everything is better, but has not gone away completely. Pt denies n/v. Pt alert & oriented, nad noted.

## 2022-10-23 NOTE — Discharge Instructions (Signed)
As we discussed, your exam, blood tests, ECG heart tracing and chest xray are all normal. There is no evidence of a blood clot, infection or other abnormality. If pain continues, please follow up with your primary care physician for recheck. If symptoms worsen or for new concern, please return to the ED at any time.

## 2022-11-06 ENCOUNTER — Encounter: Payer: Managed Care, Other (non HMO) | Admitting: Family Medicine

## 2022-11-18 ENCOUNTER — Encounter: Payer: Self-pay | Admitting: Family Medicine

## 2022-11-20 ENCOUNTER — Encounter: Payer: Self-pay | Admitting: Family Medicine

## 2022-11-20 ENCOUNTER — Ambulatory Visit (INDEPENDENT_AMBULATORY_CARE_PROVIDER_SITE_OTHER): Payer: Managed Care, Other (non HMO) | Admitting: Family Medicine

## 2022-11-20 VITALS — BP 108/64 | HR 54 | Temp 98.2°F | Resp 16 | Ht 71.0 in | Wt 198.1 lb

## 2022-11-20 DIAGNOSIS — M25572 Pain in left ankle and joints of left foot: Secondary | ICD-10-CM | POA: Diagnosis not present

## 2022-11-20 DIAGNOSIS — M25552 Pain in left hip: Secondary | ICD-10-CM

## 2022-11-20 NOTE — Patient Instructions (Addendum)
It was good to see you again today!  I am sorry that you got injured  Please call [facility] and set up an appt asap- I put in a referral for you today Please go by [facility] at your convenience to have your hip films  Address: [address] [place], [place], [place] [address] Phone: [phone]  Avoid exercise that causes pain.  I am afraid you are right- you likely need to re-route to a race in the spring or perhaps later this winter.
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
## 2023-01-09 ENCOUNTER — Encounter (INDEPENDENT_AMBULATORY_CARE_PROVIDER_SITE_OTHER): Payer: Managed Care, Other (non HMO) | Admitting: Family Medicine

## 2023-01-09 DIAGNOSIS — Z209 Contact with and (suspected) exposure to unspecified communicable disease: Secondary | ICD-10-CM

## 2023-01-09 NOTE — Telephone Encounter (Signed)

## 2023-01-10 NOTE — Addendum Note (Signed)
Addended by: Abbe Amsterdam C on: 01/10/2023 06:00 AM   Modules accepted: Orders

## 2023-04-24 ENCOUNTER — Encounter: Payer: Self-pay | Admitting: Family Medicine
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
## 2023-09-01 NOTE — Patient Instructions (Incomplete)
 It was great to see you again today, I will be in touch with your lab work

## 2023-09-03 ENCOUNTER — Encounter: Payer: Self-pay | Admitting: Family Medicine

## 2023-09-03 ENCOUNTER — Ambulatory Visit (INDEPENDENT_AMBULATORY_CARE_PROVIDER_SITE_OTHER): Admitting: Family Medicine

## 2023-09-03 VITALS — BP 122/68 | HR 58 | Temp 98.3°F | Ht 71.0 in | Wt 203.4 lb

## 2023-09-03 DIAGNOSIS — Z1329 Encounter for screening for other suspected endocrine disorder: Secondary | ICD-10-CM | POA: Diagnosis not present

## 2023-09-03 DIAGNOSIS — Z131 Encounter for screening for diabetes mellitus: Secondary | ICD-10-CM | POA: Diagnosis not present

## 2023-09-03 DIAGNOSIS — Z0000 Encounter for general adult medical examination without abnormal findings: Secondary | ICD-10-CM

## 2023-09-03 DIAGNOSIS — Z130 Encounter for screening for diseases of the blood and blood-forming organs and certain disorders involving the immune mechanism: Secondary | ICD-10-CM

## 2023-09-03 DIAGNOSIS — H6012 Cellulitis of left external ear: Secondary | ICD-10-CM | POA: Diagnosis not present

## 2023-09-03 DIAGNOSIS — Z13220 Encounter for screening for lipoid disorders: Secondary | ICD-10-CM | POA: Diagnosis not present

## 2023-09-03 LAB — CBC
HCT: 37.5 % (ref 36.0–46.0)
Hemoglobin: 12.6 g/dL (ref 12.0–15.0)
MCHC: 33.5 g/dL (ref 30.0–36.0)
MCV: 89.4 fl (ref 78.0–100.0)
Platelets: 291 K/uL (ref 150.0–400.0)
RBC: 4.19 Mil/uL (ref 3.87–5.11)
RDW: 13.8 % (ref 11.5–15.5)
WBC: 4.7 K/uL (ref 4.0–10.5)

## 2023-09-03 LAB — LIPID PANEL
Cholesterol: 149 mg/dL (ref 0–200)
HDL: 63.1 mg/dL (ref >39.00)
LDL Cholesterol: 74 mg/dL (ref 0–99)
NonHDL: 85.88
Total CHOL/HDL Ratio: 2
Triglycerides: 58 mg/dL (ref 0.0–149.0)
VLDL: 11.6 mg/dL (ref 0.0–40.0)

## 2023-09-03 LAB — TSH: TSH: 1.59 u[IU]/mL (ref 0.35–5.50)

## 2023-09-03 LAB — COMPREHENSIVE METABOLIC PANEL WITH GFR
ALT: 13 U/L (ref 0–35)
AST: 17 U/L (ref 0–37)
Albumin: 4.4 g/dL (ref 3.5–5.2)
Alkaline Phosphatase: 61 U/L (ref 39–117)
BUN: 12 mg/dL (ref 6–23)
CO2: 27 meq/L (ref 19–32)
Calcium: 9.1 mg/dL (ref 8.4–10.5)
Chloride: 104 meq/L (ref 96–112)
Creatinine, Ser: 0.66 mg/dL (ref 0.40–1.20)
GFR: 122.37 mL/min (ref >60.00)
Glucose, Bld: 93 mg/dL (ref 70–99)
Potassium: 4.3 meq/L (ref 3.5–5.1)
Sodium: 136 meq/L (ref 135–145)
Total Bilirubin: 0.6 mg/dL (ref 0.2–1.2)
Total Protein: 6.9 g/dL (ref 6.0–8.3)

## 2023-09-03 LAB — HEMOGLOBIN A1C: Hgb A1c MFr Bld: 5.4 % (ref 4.6–6.5)

## 2023-09-03 MED ORDER — CEPHALEXIN 500 MG PO CAPS: 500.0000 mg | ORAL_CAPSULE | Freq: Three times a day (TID) | ORAL | 0 refills | Status: AC

## 2023-09-12 ENCOUNTER — Encounter: Payer: Self-pay | Admitting: Family Medicine

## 2023-09-13 ENCOUNTER — Ambulatory Visit: Admitting: Physician Assistant

## 2023-09-14 ENCOUNTER — Ambulatory Visit (INDEPENDENT_AMBULATORY_CARE_PROVIDER_SITE_OTHER): Admitting: Physician Assistant

## 2023-09-14 VITALS — BP 110/72 | HR 79 | Temp 97.8°F | Ht 71.0 in | Wt 203.8 lb

## 2023-09-14 DIAGNOSIS — R519 Headache, unspecified: Secondary | ICD-10-CM | POA: Diagnosis not present

## 2023-09-14 DIAGNOSIS — R590 Localized enlarged lymph nodes: Secondary | ICD-10-CM

## 2023-09-14 NOTE — Progress Notes (Signed)
      Established patient visit   Patient: Ashley Hodges   DOB: [birth year]   22 y.o. Female  MRN: [id number] Visit Date: 09/14/2023  Today's healthcare provider: Manuelita Flatness, PA-C   Cc. Pain scalp  Subjective    Discussed the use of AI scribe software for clinical note transcription with the patient, who gave verbal consent to proceed.  History of Present Illness   [name] is a 23 year old female who presents with persistent scalp pain and bumps behind the left ear.  She experiences pain on the left side of her head towards the back, which began after running a 5K on July 4th. The pain is tender to touch, especially when brushing or washing her hair, and is associated with a bump in the area. The pain has slightly decreased but remains present. She has not experienced similar pain from sunburns in the past.  Two bumps behind her ear appeared around the same time as the scalp pain. Initially painful and larger, they have reduced in size after completing a course of Cefalexin. The bumps are no longer painful but remain present. She associates the onset of these bumps with a recent ear piercing and physical activity during the 5K.       Medications: Outpatient Medications Prior to Visit  Medication Sig   cephALEXin  (KEFLEX ) 500 MG capsule Take 1 capsule (500 mg total) by mouth 3 (three) times daily.   [DISCONTINUED] ZAFEMY 150-35 MCG/24HR transdermal patch Place 1 patch onto the skin once a week. (Patient not taking: Reported on 09/14/2023)   No facility-administered medications prior to visit.    Review of Systems  Constitutional:  Negative for fatigue and fever.  Respiratory:  Negative for cough and shortness of breath.   Cardiovascular:  Negative for chest pain and leg swelling.  Gastrointestinal:  Negative for abdominal pain.  Neurological:  Negative for dizziness and headaches.       Objective    BP 110/72   Pulse 79   Temp 97.8 F (36.6 C) (Oral)   Ht 5'  11 (1.803 m)   Wt 203 lb 12.8 oz (92.4 kg)   LMP 08/11/2023 (Exact Date)   SpO2 97%   BMI 28.42 kg/m    Physical Exam Vitals reviewed.  Constitutional:      Appearance: She is not ill-appearing.  HENT:     Head: Normocephalic.  Eyes:     Conjunctiva/sclera: Conjunctivae normal.  Cardiovascular:     Rate and Rhythm: Normal rate.  Pulmonary:     Effort: Pulmonary effort is normal. No respiratory distress.  Neurological:     Mental Status: She is alert and oriented to person, place, and time.  Psychiatric:        Mood and Affect: Mood normal.        Behavior: Behavior normal.     No results found for any visits on 09/14/23.  Assessment & Plan    Scalp pain Reassured, no suspicious findings. No scalp lesions, rash, masses. Area that feels more prominent feels vascular, not abnormal  Posterior auricular lymphadenopathy  Rec cont to monitor pt reports improvement in size, pain after abx.  If areas remain in the next 2 weeks would recommend US    Return if symptoms worsen or fail to improve.       Manuelita Flatness, PA-C  [facility] at [facility] [phone] (phone) [phone] (fax)  [facility]

## 2023-09-18 ENCOUNTER — Encounter: Payer: Self-pay | Admitting: Physician Assistant

## 2023-09-21 ENCOUNTER — Encounter: Payer: Self-pay | Admitting: Family Medicine

## 2023-10-16 ENCOUNTER — Encounter: Payer: Self-pay | Admitting: Family Medicine
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
## 2023-10-18 ENCOUNTER — Ambulatory Visit (INDEPENDENT_AMBULATORY_CARE_PROVIDER_SITE_OTHER): Admitting: Family Medicine

## 2023-10-18 VITALS — BP 116/78 | HR 81 | Ht 71.0 in

## 2023-10-18 DIAGNOSIS — F4321 Adjustment disorder with depressed mood: Secondary | ICD-10-CM | POA: Diagnosis not present

## 2023-10-18 DIAGNOSIS — R591 Generalized enlarged lymph nodes: Secondary | ICD-10-CM

## 2023-10-18 DIAGNOSIS — L720 Epidermal cyst: Secondary | ICD-10-CM | POA: Diagnosis not present

## 2023-10-18 DIAGNOSIS — F4323 Adjustment disorder with mixed anxiety and depressed mood: Secondary | ICD-10-CM | POA: Diagnosis not present

## 2023-10-18 NOTE — Patient Instructions (Addendum)
 We removed the 3 milia from your face today Wash your face with soap and water like normal tonight and tomorrow. Keep an eye on them- if any concern about infection please let me know   We can ultrasound the nodes behind your left ear if they are still present in another couple of months  Let me know if you would like to try any medication for depression/ anxiety symptoms!  I do think medication would help reduce your symptoms  Take care!

## 2023-11-29 ENCOUNTER — Encounter: Payer: Self-pay | Admitting: Family Medicine

## 2023-12-25 ENCOUNTER — Encounter: Payer: Self-pay | Admitting: Family Medicine

## 2024-01-10 NOTE — Telephone Encounter (Signed)
 Received alert that she did not read mychart message.  Called and LMOM with rec that she read message and get rabies booster dose
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
## 2024-03-06 ENCOUNTER — Ambulatory Visit: Admitting: Family Medicine

## 2024-03-06 VITALS — BP 116/74 | HR 62 | Ht 71.0 in | Wt 212.0 lb

## 2024-03-06 DIAGNOSIS — R1084 Generalized abdominal pain: Secondary | ICD-10-CM

## 2024-03-06 DIAGNOSIS — R220 Localized swelling, mass and lump, head: Secondary | ICD-10-CM | POA: Diagnosis not present

## 2024-03-06 LAB — POCT URINE PREGNANCY: Preg Test, Ur: NEGATIVE

## 2024-03-06 MED ORDER — DICYCLOMINE HCL 10 MG PO CAPS: 10.0000 mg | ORAL_CAPSULE | Freq: Three times a day (TID) | ORAL | 0 refills | Status: AC

## 2024-03-06 NOTE — Patient Instructions (Signed)
 Good to see you today I will be in touch with your labs Please stop at imaging on the ground floor to set up your gallbladder ultrasound and ultrasound for behind your ear

## 2024-03-07 ENCOUNTER — Encounter: Payer: Self-pay | Admitting: Family Medicine

## 2024-03-07 LAB — CBC
HCT: 38.9 % (ref 36.0–46.0)
Hemoglobin: 12.9 g/dL (ref 12.0–15.0)
MCHC: 33.2 g/dL (ref 30.0–36.0)
MCV: 89.6 fl (ref 78.0–100.0)
Platelets: 297 K/uL (ref 150.0–400.0)
RBC: 4.34 Mil/uL (ref 3.87–5.11)
RDW: 13.5 % (ref 11.5–15.5)
WBC: 5.1 K/uL (ref 4.0–10.5)

## 2024-03-07 LAB — COMPREHENSIVE METABOLIC PANEL WITH GFR
ALT: 12 U/L (ref 3–35)
AST: 17 U/L (ref 5–37)
Albumin: 4.4 g/dL (ref 3.5–5.2)
Alkaline Phosphatase: 62 U/L (ref 39–117)
BUN: 16 mg/dL (ref 6–23)
CO2: 28 meq/L (ref 19–32)
Calcium: 9.2 mg/dL (ref 8.4–10.5)
Chloride: 102 meq/L (ref 96–112)
Creatinine, Ser: 0.67 mg/dL (ref 0.40–1.20)
GFR: 121.49 mL/min
Glucose, Bld: 83 mg/dL (ref 70–99)
Potassium: 4.1 meq/L (ref 3.5–5.1)
Sodium: 137 meq/L (ref 135–145)
Total Bilirubin: 0.6 mg/dL (ref 0.2–1.2)
Total Protein: 7 g/dL (ref 6.0–8.3)

## 2024-03-07 LAB — GLIADIN ANTIBODIES, SERUM
Deamidated Gliadin Abs, IgG: 2.1 U/mL
Gliadin IgA: 1 U/mL

## 2024-03-07 LAB — TISSUE TRANSGLUTAMINASE, IGA: (tTG) Ab, IgA: <1 U/mL

## 2024-03-07 LAB — LIPASE: Lipase: 23 U/L (ref 11.0–59.0)

## 2024-03-09 ENCOUNTER — Encounter: Payer: Self-pay | Admitting: Family Medicine

## 2024-03-09 ENCOUNTER — Ambulatory Visit (HOSPITAL_BASED_OUTPATIENT_CLINIC_OR_DEPARTMENT_OTHER)

## 2024-03-09 ENCOUNTER — Ambulatory Visit (HOSPITAL_BASED_OUTPATIENT_CLINIC_OR_DEPARTMENT_OTHER): Admission: RE | Admit: 2024-03-09

## 2024-03-27 ENCOUNTER — Other Ambulatory Visit (HOSPITAL_BASED_OUTPATIENT_CLINIC_OR_DEPARTMENT_OTHER): Payer: Self-pay | Admitting: Family Medicine

## 2024-03-27 DIAGNOSIS — R221 Localized swelling, mass and lump, neck: Secondary | ICD-10-CM

## 2024-03-27 DIAGNOSIS — R1011 Right upper quadrant pain: Secondary | ICD-10-CM

## 2024-03-29 ENCOUNTER — Ambulatory Visit (HOSPITAL_BASED_OUTPATIENT_CLINIC_OR_DEPARTMENT_OTHER)

## 2024-03-29 ENCOUNTER — Ambulatory Visit (HOSPITAL_BASED_OUTPATIENT_CLINIC_OR_DEPARTMENT_OTHER): Admission: RE | Admit: 2024-03-29 | Source: Ambulatory Visit

## 2024-04-13 ENCOUNTER — Ambulatory Visit (HOSPITAL_BASED_OUTPATIENT_CLINIC_OR_DEPARTMENT_OTHER)
